# Patient Record
Sex: Male | Born: 1960 | Race: White | Hispanic: No | Marital: Single | Smoking: Never smoker
Health system: Southern US, Community
[De-identification: ages and names within clinical notes are randomized; demographics above are authoritative.]

## PROBLEM LIST (undated history)

## (undated) HISTORY — PX: HERNIA REPAIR: SHX51

---

## 2013-07-12 ENCOUNTER — Emergency Department (HOSPITAL_COMMUNITY): Payer: BC Managed Care – PPO

## 2013-07-12 ENCOUNTER — Encounter (HOSPITAL_COMMUNITY): Payer: Self-pay | Admitting: Emergency Medicine

## 2013-07-12 ENCOUNTER — Emergency Department (HOSPITAL_COMMUNITY)
Admission: EM | Admit: 2013-07-12 | Discharge: 2013-07-12 | Disposition: A | Discharge status: Home / Self Care | Payer: BC Managed Care – PPO | Attending: Emergency Medicine | Admitting: Emergency Medicine

## 2013-07-12 DIAGNOSIS — R5383 Other fatigue: Secondary | ICD-10-CM

## 2013-07-12 DIAGNOSIS — R5381 Other malaise: Secondary | ICD-10-CM | POA: Insufficient documentation

## 2013-07-12 DIAGNOSIS — J40 Bronchitis, not specified as acute or chronic: Secondary | ICD-10-CM

## 2013-07-12 DIAGNOSIS — J3489 Other specified disorders of nose and nasal sinuses: Secondary | ICD-10-CM | POA: Insufficient documentation

## 2013-07-12 DIAGNOSIS — J209 Acute bronchitis, unspecified: Secondary | ICD-10-CM | POA: Insufficient documentation

## 2013-07-12 MED ORDER — AZITHROMYCIN 250 MG PO TABS: ORAL_TABLET | ORAL | Status: AC

## 2013-07-12 MED ORDER — HYDROCOD POLST-CHLORPHEN POLST 10-8 MG/5ML PO LQCR: 5.0000 mL | Freq: Two times a day (BID) | ORAL | Status: AC | PRN

## 2013-07-12 MED ORDER — ALBUTEROL SULFATE HFA 108 (90 BASE) MCG/ACT IN AERS
2.0000 | INHALATION_SPRAY | Freq: Once | RESPIRATORY_TRACT | Status: AC
  Administered 2013-07-12: 2 via RESPIRATORY_TRACT
  Filled 2013-07-12: qty 6.7

## 2013-07-12 MED ORDER — AZITHROMYCIN 250 MG PO TABS
500.0000 mg | ORAL_TABLET | Freq: Once | ORAL | Status: AC
  Administered 2013-07-12: 500 mg via ORAL
  Filled 2013-07-12: qty 2

## 2013-07-12 NOTE — ED Notes (Signed)
Cough,  And rib pain with cough, no fever.  Nasal congestion.   No sore throat, . Feels "a little dizzy"

## 2013-07-12 NOTE — Discharge Instructions (Signed)
Bronchitis °Bronchitis is swelling (inflammation) of the air tubes leading to your lungs (bronchi). This causes mucus and a cough. If the swelling gets bad, you may have trouble breathing. °HOME CARE  °· Rest. °· Drink enough fluids to keep your pee (urine) clear or pale yellow (unless you have a condition where you have to watch how much you drink). °· Only take medicine as told by your doctor. If you were given antibiotic medicines, finish them even if you start to feel better. °· Avoid smoke, irritating chemicals, and strong smells. These make the problem worse. Quit smoking if you smoke. This helps your lungs heal faster. °· Use a cool mist humidifier. Change the water in the humidifier every day. You can also sit in the bathroom with hot shower running for 5 10 minutes. Keep the door closed. °· See your health care provider as told. °· Wash your hands often. °GET HELP IF: °Your problems do not get better after 1 week. °GET HELP RIGHT AWAY IF:  °· Your fever gets worse. °· You have chills. °· Your chest hurts. °· Your problems breathing get worse. °· You have blood in your mucus. °· You pass out (faint). °· You feel lightheaded. °· You have a bad headache. °· You throw up (vomit) again and again. °MAKE SURE YOU: °· Understand these instructions. °· Will watch your condition. °· Will get help right away if you are not doing well or get worse. °Document Released: 11/23/2007 Document Revised: 03/27/2013 Document Reviewed: 01/29/2013 °[employer]® Patient Information ©2014 [employer]. ° °

## 2013-07-14 NOTE — ED Provider Notes (Signed)
CSN: [id number]     Arrival date & time 07/12/13  1245 History   First MD Initiated Contact with Patient 07/12/13 1407     Chief Complaint  Patient presents with  . Cough   (Consider location/radiation/quality/duration/timing/severity/associated sxs/prior Treatment) Patient is a 53 y.o. male presenting with cough. The history is provided by the patient.  Cough Cough characteristics:  Productive Sputum characteristics:  Yellow and green Severity:  Moderate Onset quality:  Gradual Duration:  3 days Timing:  Constant Progression:  Unchanged Chronicity:  New Smoker: no   Context: upper respiratory infection   Relieved by:  Nothing Exacerbated by: talking. Ineffective treatments:  Cough suppressants Associated symptoms: myalgias and sinus congestion   Associated symptoms: no chills, no ear pain, no fever, no headaches, no rash, no rhinorrhea, no shortness of breath, no sore throat, no weight loss and no wheezing   Associated symptoms comment:  Bilateral rib pain with cough Myalgias:    Location:  Generalized   Severity:  Mild   Onset quality:  Gradual   Timing:  Intermittent   Progression:  Waxing and waning Risk factors: no recent travel     History reviewed. No pertinent past medical history. Past Surgical History  Procedure Laterality Date  . Hernia repair     History reviewed. No pertinent family history. History  Substance Use Topics  . Smoking status: Never Smoker   . Smokeless tobacco: Not on file  . Alcohol Use: Yes    Review of Systems  Constitutional: Negative for fever, chills, weight loss, activity change and appetite change.  HENT: Positive for congestion. Negative for ear pain, facial swelling, rhinorrhea, sore throat and trouble swallowing.   Eyes: Negative for visual disturbance.  Respiratory: Positive for cough. Negative for chest tightness, shortness of breath, wheezing and stridor.   Cardiovascular:       Rib pain  Gastrointestinal: Negative for  nausea, vomiting and abdominal pain.  Genitourinary: Negative for dysuria, hematuria and flank pain.  Musculoskeletal: Positive for myalgias. Negative for back pain, neck pain and neck stiffness.  Skin: Negative.  Negative for rash.  Neurological: Negative for dizziness, weakness, numbness and headaches.  Hematological: Negative for adenopathy.  Psychiatric/Behavioral: Negative for confusion.  All other systems reviewed and are negative.    Allergies  Review of patient's allergies indicates no known allergies.  Home Medications   Current Outpatient Rx  Name  Route  Sig  Dispense  Refill  . azithromycin (ZITHROMAX Z-PAK) 250 MG tablet      Take two tablets on day one, then one tab qd days 2-5   6 tablet   0   . chlorpheniramine-HYDROcodone (TUSSIONEX PENNKINETIC ER) 10-8 MG/5ML LQCR   Oral   Take 5 mLs by mouth every 12 (twelve) hours as needed for cough.   60 mL   0   . OVER THE COUNTER MEDICATION   Oral   Take 30 mLs by mouth daily as needed (for cold symptoms). OTC generic nyquil          BP 136/75  Pulse 80  Temp(Src) 98.1 F (36.7 C) (Oral)  Resp 18  Ht 5\' 10"  (1.778 m)  Wt 230 lb (104.327 kg)  BMI 33.00 kg/m2  SpO2 99% Physical Exam  Nursing note and vitals reviewed. Constitutional: He is oriented to person, place, and time. He appears well-developed and well-nourished. No distress.  HENT:  Head: Normocephalic and atraumatic.  Right Ear: Tympanic membrane and ear canal normal.  Left Ear: Tympanic membrane and  ear canal normal.  Mouth/Throat: Uvula is midline, oropharynx is clear and moist and mucous membranes are normal. No oropharyngeal exudate.  Eyes: EOM are normal. Pupils are equal, round, and reactive to light.  Neck: Normal range of motion, full passive range of motion without pain and phonation normal. Neck supple.  Cardiovascular: Normal rate, regular rhythm, normal heart sounds and intact distal pulses.   No murmur heard. Pulmonary/Chest: Effort  normal. No stridor. No respiratory distress. He has no rales. He exhibits no tenderness.  Coarse lungs sounds bilaterally.  No rales, wheezing or stridor  Musculoskeletal: He exhibits no edema.  Lymphadenopathy:    He has no cervical adenopathy.  Neurological: He is alert and oriented to person, place, and time. He exhibits normal muscle tone. Coordination normal.  Skin: Skin is warm and dry.    ED Course  Procedures (including critical care time) Labs Review Labs Reviewed - No data to display Imaging Review Dg Chest 2 View  07/12/2013   CLINICAL DATA:  Cough.  Rib pain.  EXAM: CHEST  2 VIEW  COMPARISON:  None.  FINDINGS: The lungs are clear. Heart size is normal. No pneumothorax or pleural fluid. No focal bony abnormality.  IMPRESSION: Negative chest.   Electronically Signed   By: Drusilla Kannerhomas  Dalessio M.D.   On: 07/12/2013 13:42    EKG Interpretation   None       MDM   1. Bronchitis     VSS.  Pt is non-toxic appearing.  Albuterol inhaler dispensed.  Pt is feeling better and appears stable for discharge.  Agrees to close f/u with his PMD if needed.   Shalika Arntz L. Kimiye Strathman, PA-C 07/14/13 1702

## 2013-07-15 NOTE — ED Provider Notes (Signed)
Medical screening examination/treatment/procedure(s) were performed by non-physician practitioner and as supervising physician I was immediately available for consultation/collaboration.  EKG Interpretation   None         Laray AngerKathleen M Luisantonio Adinolfi, DO 07/15/13 [id number]

## 2014-10-09 IMAGING — CR DG CHEST 2V
2 series · 2 of 2 positions shown · non-contrast
Comparison: None.

CLINICAL DATA: Cough.  Rib pain.

EXAM:
CHEST  2 VIEW

[view not recorded (1 of 2)]
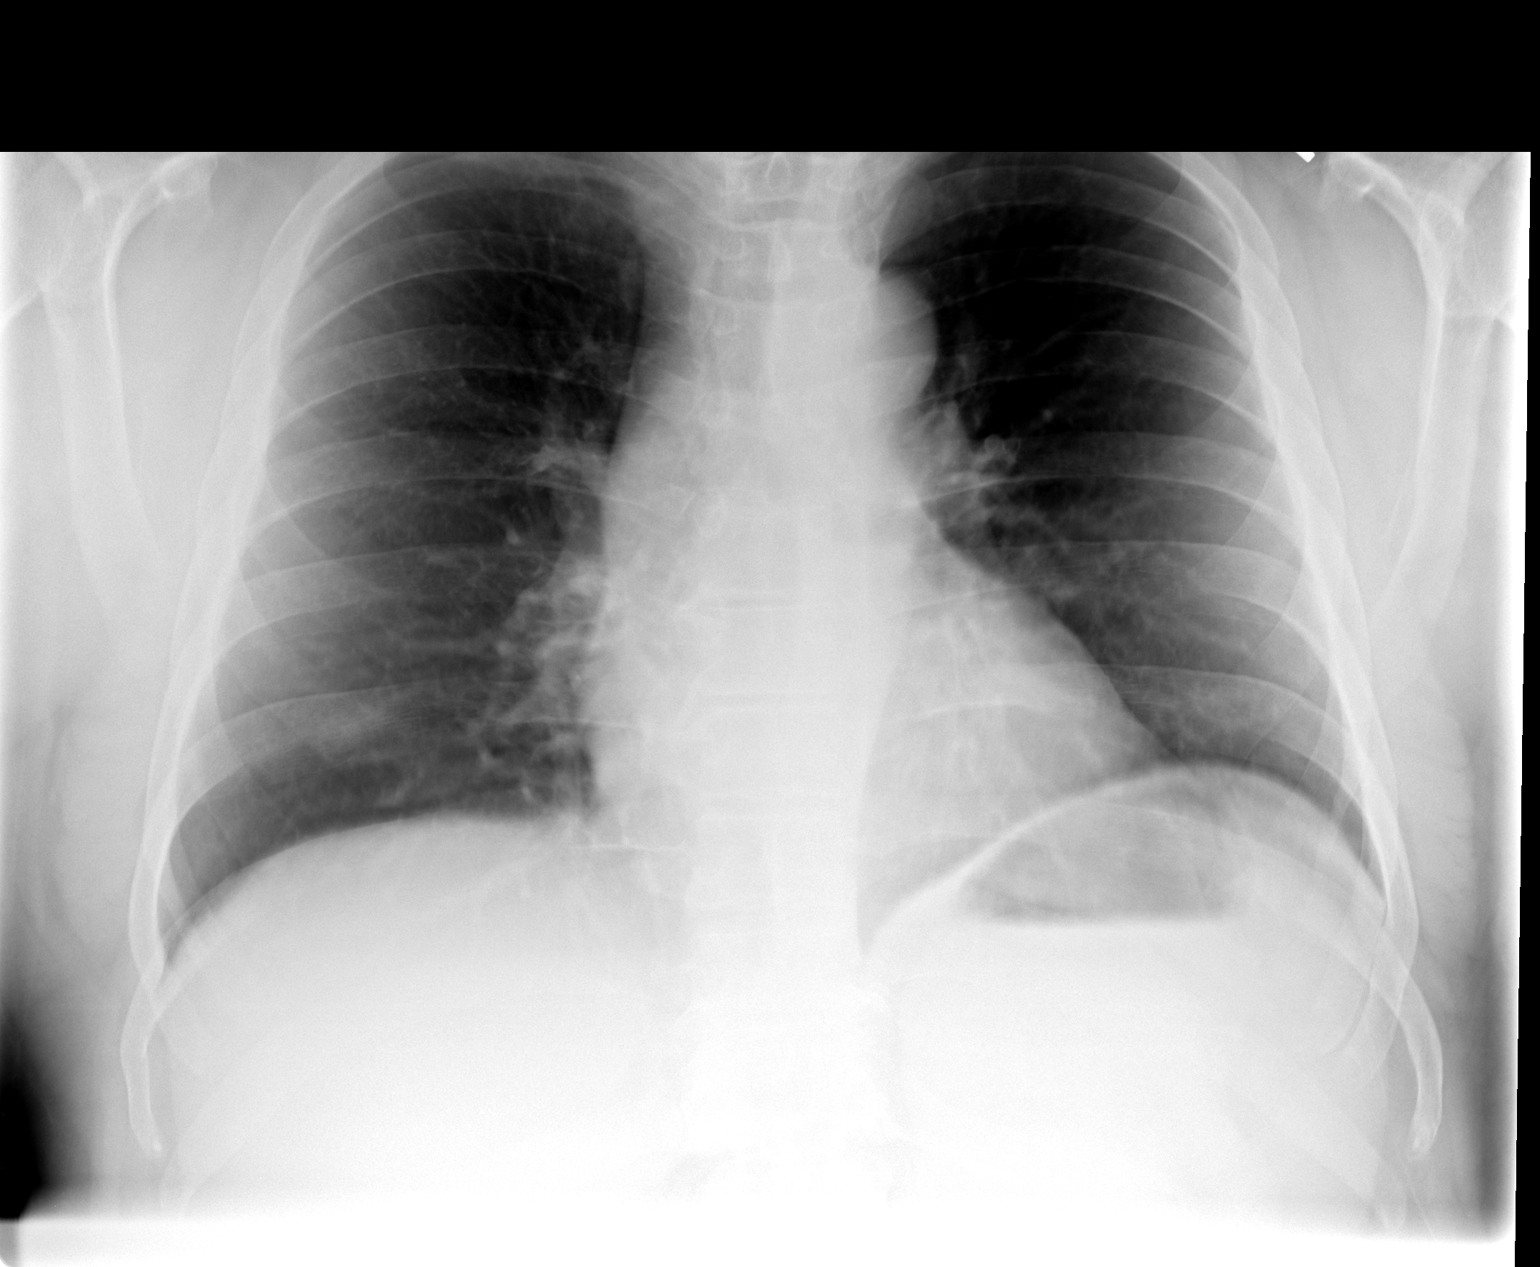

[view not recorded (2 of 2)]
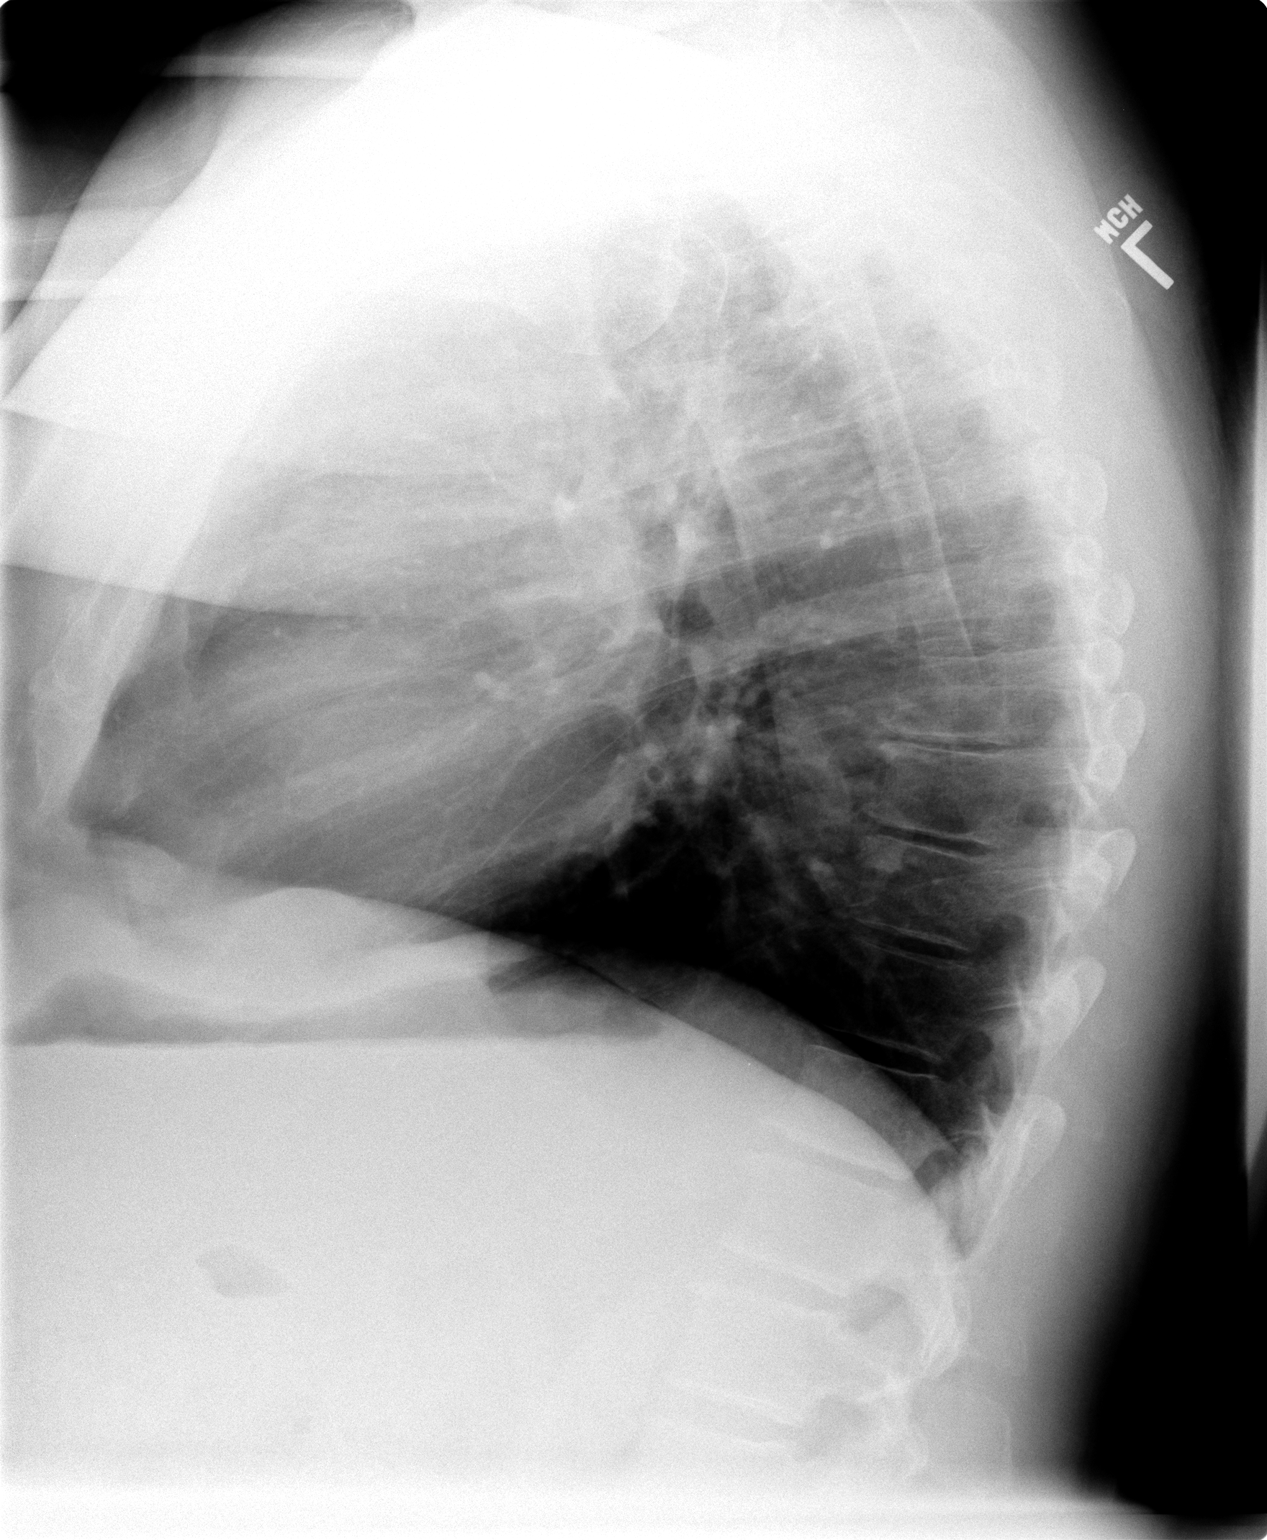

[2 of 2 positions shown; findings below may reference images not displayed]

FINDINGS: The lungs are clear. Heart size is normal. No pneumothorax or
pleural fluid. No focal bony abnormality.
IMPRESSION: Negative chest.
